# Patient Record
Sex: Male | Born: 1965 | Race: Asian | Hispanic: No | Smoking: Current every day smoker
Health system: Southern US, Community
[De-identification: ages and names within clinical notes are randomized; demographics above are authoritative.]

## PROBLEM LIST (undated history)

## (undated) DIAGNOSIS — K219 Gastro-esophageal reflux disease without esophagitis: Secondary | ICD-10-CM

## (undated) DIAGNOSIS — F419 Anxiety disorder, unspecified: Secondary | ICD-10-CM

## (undated) DIAGNOSIS — F32A Depression, unspecified: Secondary | ICD-10-CM

## (undated) DIAGNOSIS — F329 Major depressive disorder, single episode, unspecified: Secondary | ICD-10-CM

## (undated) HISTORY — PX: VARICOSE VEIN SURGERY: SHX832

## (undated) HISTORY — PX: NASAL SEPTUM SURGERY: SHX37

## (undated) HISTORY — PX: TONSILLECTOMY: SUR1361

---

## 2016-05-13 ENCOUNTER — Emergency Department
Admission: EM | Admit: 2016-05-13 | Discharge: 2016-05-14 | Disposition: A | Discharge status: Home / Self Care | Payer: 59 | Attending: Emergency Medicine | Admitting: Emergency Medicine

## 2016-05-13 ENCOUNTER — Encounter: Payer: Self-pay | Admitting: Emergency Medicine

## 2016-05-13 ENCOUNTER — Emergency Department: Payer: 59

## 2016-05-13 DIAGNOSIS — S8251XA Displaced fracture of medial malleolus of right tibia, initial encounter for closed fracture: Secondary | ICD-10-CM | POA: Insufficient documentation

## 2016-05-13 DIAGNOSIS — S82401A Unspecified fracture of shaft of right fibula, initial encounter for closed fracture: Secondary | ICD-10-CM

## 2016-05-13 DIAGNOSIS — Y929 Unspecified place or not applicable: Secondary | ICD-10-CM | POA: Diagnosis not present

## 2016-05-13 DIAGNOSIS — Y999 Unspecified external cause status: Secondary | ICD-10-CM | POA: Diagnosis not present

## 2016-05-13 DIAGNOSIS — W010XXA Fall on same level from slipping, tripping and stumbling without subsequent striking against object, initial encounter: Secondary | ICD-10-CM | POA: Diagnosis not present

## 2016-05-13 DIAGNOSIS — S82431A Displaced oblique fracture of shaft of right fibula, initial encounter for closed fracture: Secondary | ICD-10-CM | POA: Diagnosis not present

## 2016-05-13 DIAGNOSIS — S82891A Other fracture of right lower leg, initial encounter for closed fracture: Secondary | ICD-10-CM

## 2016-05-13 DIAGNOSIS — Y939 Activity, unspecified: Secondary | ICD-10-CM | POA: Diagnosis not present

## 2016-05-13 DIAGNOSIS — F17200 Nicotine dependence, unspecified, uncomplicated: Secondary | ICD-10-CM | POA: Diagnosis not present

## 2016-05-13 DIAGNOSIS — S8991XA Unspecified injury of right lower leg, initial encounter: Secondary | ICD-10-CM | POA: Diagnosis present

## 2016-05-13 HISTORY — DX: Major depressive disorder, single episode, unspecified: F32.9

## 2016-05-13 HISTORY — DX: Depression, unspecified: F32.A

## 2016-05-13 MED ORDER — TRAMADOL HCL 50 MG PO TABS: 50.0000 mg | ORAL_TABLET | Freq: Four times a day (QID) | ORAL | 0 refills | Status: AC | PRN

## 2016-05-13 MED ORDER — IBUPROFEN 600 MG PO TABS: 600.0000 mg | ORAL_TABLET | Freq: Three times a day (TID) | ORAL | 0 refills | Status: AC | PRN

## 2016-05-13 MED ORDER — IBUPROFEN 600 MG PO TABS
600.0000 mg | ORAL_TABLET | Freq: Once | ORAL | Status: AC
  Administered 2016-05-13: 600 mg via ORAL
  Filled 2016-05-13: qty 1

## 2016-05-13 MED ORDER — TRAMADOL HCL 50 MG PO TABS
50.0000 mg | ORAL_TABLET | Freq: Once | ORAL | Status: AC
Start: 2016-05-14 — End: 2016-05-13
  Administered 2016-05-13: 50 mg via ORAL
  Filled 2016-05-13: qty 1

## 2016-05-13 NOTE — ED Notes (Signed)
Pt had mechanical fall tonight, c/o RT ankle pain, unable to bear weight. No deformity noted.

## 2016-05-13 NOTE — ED Triage Notes (Signed)
Tripped - fell, rt ankle/shin pain. Brought in by ems. Pt in one of their splints

## 2016-05-13 NOTE — ED Provider Notes (Signed)
[facility] Emergency Department Provider Note   ____________________________________________   None    (approximate)  I have reviewed the triage vital signs and the nursing notes.   HISTORY  Chief Complaint Ankle Pain    HPI Joshua Matthews is a 50 y.o. male patient complain of right shin and right medial ankle pain secondary to a trip and fall. Patient brought in by EMS place in a splint. Patient stated pain increases with weightbearing. Patient rates his pain as a 6/10. Patient refuses pain medication at this time. The pain as a dull constant ache. No palliative measures for this complaint.   Past Medical History:  Diagnosis Date  . Depression     There are no active problems to display for this patient.   History reviewed. No pertinent surgical history.  Prior to Admission medications   Medication Sig Start Date End Date Taking? Authorizing Provider  ibuprofen (ADVIL,MOTRIN) 600 MG tablet Take 1 tablet (600 mg total) by mouth every 8 (eight) hours as needed. 05/13/16   Joni Reiningonald K Mckynzi Cammon, PA-C  traMADol (ULTRAM) 50 MG tablet Take 1 tablet (50 mg total) by mouth every 6 (six) hours as needed. 05/13/16 05/13/17  Joni Reiningonald K Yechiel Erny, PA-C    Allergies Patient has no known allergies.  History reviewed. No pertinent family history.  Social History Social History  Substance Use Topics  . Smoking status: Current Every Day Smoker    Packs/day: 0.40  . Smokeless tobacco: Never Used  . Alcohol use Yes     Comment: ocassionally    Review of Systems Constitutional: No fever/chills Eyes: No visual changes. ENT: No sore throat. Cardiovascular: Denies chest pain. Respiratory: Denies shortness of breath. Gastrointestinal: No abdominal pain.  No nausea, no vomiting.  No diarrhea.  No constipation. Genitourinary: Negative for dysuria. Musculoskeletal: Left leg and ankle pain. Skin: Negative for rash. Neurological: Negative for headaches, focal weakness  or numbness. Psychiatric:Depression 10-point ROS otherwise negative.  ____________________________________________   PHYSICAL EXAM:  VITAL SIGNS: ED Triage Vitals  Enc Vitals Group     BP 05/13/16 2241 (!) 111/54     Pulse Rate 05/13/16 2241 79     Resp 05/13/16 2241 18     Temp 05/13/16 2241 98.5 F (36.9 C)     Temp src --      SpO2 05/13/16 2241 97 %     Weight 05/13/16 2242 195 lb (88.5 kg)     Height 05/13/16 2242 5\' 8"  (1.727 m)     Head Circumference --      Peak Flow --      Pain Score 05/13/16 2242 6     Pain Loc --      Pain Edu? --      Excl. in GC? --     Constitutional: Alert and oriented. Well appearing and in no acute distress. Eyes: Conjunctivae are normal. PERRL. EOMI. Head: Atraumatic. Nose: No congestion/rhinnorhea. Mouth/Throat: Mucous membranes are moist.  Oropharynx non-erythematous. Neck: No stridor.  No cervical spine tenderness to palpation. Hematological/Lymphatic/Immunilogical: No cervical lymphadenopathy. Cardiovascular: Normal rate, regular rhythm. Grossly normal heart sounds.  Good peripheral circulation. Respiratory: Normal respiratory effort.  No retractions. Lungs CTAB. Gastrointestinal: Soft and nontender. No distention. No abdominal bruits. No CVA tenderness. Musculoskeletal: No lower extremity tenderness nor edema.  No joint effusions. Neurologic:  Normal speech and language. No gross focal neurologic deficits are appreciated. No gait instability. Skin:  Skin is warm, dry and intact. No rash noted. Psychiatric: Mood and affect  are normal. Speech and behavior are normal.  ____________________________________________   LABS (all labs ordered are listed, but only abnormal results are displayed)  Labs Reviewed - No data to display ____________________________________________  EKG   ____________________________________________  RADIOLOGY  Right proximal fibular fracture and right medial malleolus  fracture. ____________________________________________   PROCEDURES  Procedure(s) performed: None  Procedures  Critical Care performed: No  ____________________________________________   INITIAL IMPRESSION / ASSESSMENT AND PLAN / ED COURSE  Pertinent labs & imaging results that were available during my care of the patient were reviewed by me and considered in my medical decision making (see chart for details).  Right proximal fibular fracture and right medial malleolus fracture. Patient advised to wear splint until evaluation by orthopedics. Advised patient to call in the morning to schedule appointment. Patient given a prescription for tramadol and ibuprofen.  Clinical Course    Patient placed in a posterior splint and given crutches for ambulation.  ____________________________________________   FINAL CLINICAL IMPRESSION(S) / ED DIAGNOSES  Final diagnoses:  Closed fracture of shaft of right fibula, unspecified fracture morphology, initial encounter  Closed fracture of right ankle, initial encounter      NEW MEDICATIONS STARTED DURING THIS VISIT:  New Prescriptions   IBUPROFEN (ADVIL,MOTRIN) 600 MG TABLET    Take 1 tablet (600 mg total) by mouth every 8 (eight) hours as needed.   TRAMADOL (ULTRAM) 50 MG TABLET    Take 1 tablet (50 mg total) by mouth every 6 (six) hours as needed.     Note:  This document was prepared using Dragon voice recognition software and may include unintentional dictation errors.    Joni Reiningonald K Liannah Yarbough, PA-C 05/13/16 2358    Jene Everyobert Kinner, MD 05/14/16 1027

## 2016-05-13 NOTE — Discharge Instructions (Signed)
Wear splint and ambulate with crutches until evaluation by orthopedic Dr. °

## 2016-05-17 ENCOUNTER — Encounter
Admission: RE | Admit: 2016-05-17 | Discharge: 2016-05-17 | Disposition: A | Discharge status: Home / Self Care | Payer: Commercial Managed Care - HMO | Source: Ambulatory Visit | Attending: Podiatry | Admitting: Podiatry

## 2016-05-17 HISTORY — DX: Anxiety disorder, unspecified: F41.9

## 2016-05-17 HISTORY — DX: Gastro-esophageal reflux disease without esophagitis: K21.9

## 2016-05-17 MED ORDER — CEFAZOLIN SODIUM-DEXTROSE 2-4 GM/100ML-% IV SOLN
2.0000 g | Freq: Once | INTRAVENOUS | Status: AC
  Administered 2016-05-18: 2 g via INTRAVENOUS

## 2016-05-17 NOTE — Patient Instructions (Signed)
  Your procedure is scheduled on: 05-18-16 Report to Same Day Surgery 2nd floor medical mall [facility](Medical Mall Entrance-take elevator on left to 2nd floor.  Check in with surgery information desk.) To find out your arrival time please call [phone] between 1PM - 3PM on 05-17-16  Remember: Instructions that are not followed completely may result in serious medical risk, up to and including death, or upon the discretion of your surgeon and anesthesiologist your surgery may need to be rescheduled.    _x___ 1. Do not eat food or drink liquids after midnight. No gum chewing or hard candies.     __x__ 2. No Alcohol for 24 hours before or after surgery.   __x__3. No Smoking for 24 prior to surgery.   ____  4. Bring all medications with you on the day of surgery if instructed.    __x__ 5. Notify your doctor if there is any change in your medical condition     (cold, fever, infections).     Do not wear jewelry, make-up, hairpins, clips or nail polish.  Do not wear lotions, powders, or perfumes. You may wear deodorant.  Do not shave 48 hours prior to surgery. Men may shave face and neck.  Do not bring valuables to the hospital.    [facility] not [facility] for any belongings or valuables.               Contacts, dentures or bridgework may not be worn into surgery.  Leave your suitcase in the car. After surgery it may be brought to your room.  For patients admitted to the hospital, discharge time is determined by your treatment team.   Patients discharged the day of surgery will not be allowed to drive home.  You will need someone to drive you home and stay with you the night of your procedure.    Please read over the following fact sheets that you were given:   [facility] Preparing [facility] and or MRSA Information   _x___ Take these medicines the morning of surgery with A SIP OF WATER:    1. LEXAPRO  2.  3.  4.  5.  6.  ____Fleets enema or Magnesium Citrate as  directed.   ____ Use CHG Soap or sage wipes as directed on instruction sheet   ____ Use inhalers on the day of surgery and bring to hospital day of surgery  ____ Stop metformin 2 days prior to surgery    ____ Take 1/2 of usual insulin dose the night before surgery and none on the morning of surgery.   ____ Stop Aspirin, Coumadin, Pllavix ,Eliquis, Effient, or Pradaxa  x__ Stop Anti-inflammatories such as Advil, Aleve, Ibuprofen, Motrin, Naproxen,          Naprosyn, Goodies powders or aspirin products-STOP IBUPROFEN NOW-Ok to take Tylenol OR TRAMADOL   ____ Stop supplements until after surgery.    ____ Bring C-Pap to the hospital.

## 2016-05-18 ENCOUNTER — Ambulatory Visit
Admission: RE | Admit: 2016-05-18 | Discharge: 2016-05-18 | Disposition: A | Discharge status: Home / Self Care | Payer: Commercial Managed Care - HMO | Source: Ambulatory Visit | Attending: Podiatry | Admitting: Podiatry

## 2016-05-18 ENCOUNTER — Encounter
Admission: RE | Disposition: A | Discharge status: Home / Self Care | Payer: Self-pay | Source: Ambulatory Visit | Attending: Podiatry

## 2016-05-18 ENCOUNTER — Encounter: Payer: Self-pay | Admitting: *Deleted

## 2016-05-18 ENCOUNTER — Ambulatory Visit: Payer: Commercial Managed Care - HMO | Admitting: Anesthesiology

## 2016-05-18 ENCOUNTER — Ambulatory Visit: Payer: Commercial Managed Care - HMO

## 2016-05-18 DIAGNOSIS — K219 Gastro-esophageal reflux disease without esophagitis: Secondary | ICD-10-CM | POA: Diagnosis not present

## 2016-05-18 DIAGNOSIS — S82841A Displaced bimalleolar fracture of right lower leg, initial encounter for closed fracture: Secondary | ICD-10-CM | POA: Insufficient documentation

## 2016-05-18 DIAGNOSIS — F17210 Nicotine dependence, cigarettes, uncomplicated: Secondary | ICD-10-CM | POA: Insufficient documentation

## 2016-05-18 DIAGNOSIS — F419 Anxiety disorder, unspecified: Secondary | ICD-10-CM | POA: Diagnosis not present

## 2016-05-18 DIAGNOSIS — X58XXXA Exposure to other specified factors, initial encounter: Secondary | ICD-10-CM | POA: Insufficient documentation

## 2016-05-18 DIAGNOSIS — F329 Major depressive disorder, single episode, unspecified: Secondary | ICD-10-CM | POA: Insufficient documentation

## 2016-05-18 DIAGNOSIS — S93431A Sprain of tibiofibular ligament of right ankle, initial encounter: Secondary | ICD-10-CM | POA: Insufficient documentation

## 2016-05-18 DIAGNOSIS — Z419 Encounter for procedure for purposes other than remedying health state, unspecified: Secondary | ICD-10-CM

## 2016-05-18 HISTORY — PX: ORIF ANKLE FRACTURE: SHX5408

## 2016-05-18 SURGERY — OPEN REDUCTION INTERNAL FIXATION (ORIF) ANKLE FRACTURE
Anesthesia: General | Site: Ankle | Laterality: Right | Wound class: Clean

## 2016-05-18 MED ORDER — MIDAZOLAM HCL 2 MG/2ML IJ SOLN
INTRAMUSCULAR | Status: DC | PRN
  Administered 2016-05-18: 2 mg via INTRAVENOUS

## 2016-05-18 MED ORDER — DEXAMETHASONE SODIUM PHOSPHATE 10 MG/ML IJ SOLN
INTRAMUSCULAR | Status: DC | PRN
  Administered 2016-05-18: 8 mg via INTRAVENOUS

## 2016-05-18 MED ORDER — BUPIVACAINE HCL (PF) 0.25 % IJ SOLN
INTRAMUSCULAR | Status: AC
  Filled 2016-05-18: qty 30

## 2016-05-18 MED ORDER — OXYCODONE-ACETAMINOPHEN 5-325 MG PO TABS: 1.0000 | ORAL_TABLET | ORAL | 0 refills | Status: AC | PRN

## 2016-05-18 MED ORDER — ONDANSETRON HCL 4 MG/2ML IJ SOLN
INTRAMUSCULAR | Status: DC | PRN
  Administered 2016-05-18: 4 mg via INTRAVENOUS

## 2016-05-18 MED ORDER — FAMOTIDINE 20 MG PO TABS
ORAL_TABLET | ORAL | Status: AC
  Filled 2016-05-18: qty 1

## 2016-05-18 MED ORDER — ONDANSETRON HCL 4 MG/2ML IJ SOLN: 4.0000 mg | Freq: Once | INTRAMUSCULAR | Status: DC | PRN

## 2016-05-18 MED ORDER — LIDOCAINE HCL (PF) 1 % IJ SOLN
INTRAMUSCULAR | Status: AC
  Filled 2016-05-18: qty 30

## 2016-05-18 MED ORDER — ROPIVACAINE HCL 5 MG/ML IJ SOLN
INTRAMUSCULAR | Status: AC
  Filled 2016-05-18: qty 40

## 2016-05-18 MED ORDER — FENTANYL CITRATE (PF) 100 MCG/2ML IJ SOLN: 25.0000 ug | INTRAMUSCULAR | Status: DC | PRN

## 2016-05-18 MED ORDER — BUPIVACAINE HCL (PF) 0.5 % IJ SOLN
INTRAMUSCULAR | Status: AC
  Filled 2016-05-18: qty 30

## 2016-05-18 MED ORDER — MIDAZOLAM HCL 2 MG/2ML IJ SOLN
INTRAMUSCULAR | Status: AC
  Administered 2016-05-18: 2 mg via INTRAVENOUS
  Filled 2016-05-18: qty 2

## 2016-05-18 MED ORDER — MIDAZOLAM HCL 2 MG/2ML IJ SOLN
2.0000 mg | Freq: Once | INTRAMUSCULAR | Status: AC
  Administered 2016-05-18: 2 mg via INTRAVENOUS

## 2016-05-18 MED ORDER — CEFAZOLIN SODIUM-DEXTROSE 2-4 GM/100ML-% IV SOLN
INTRAVENOUS | Status: AC
  Filled 2016-05-18: qty 100

## 2016-05-18 MED ORDER — ONDANSETRON HCL 4 MG/2ML IJ SOLN: 4.0000 mg | Freq: Four times a day (QID) | INTRAMUSCULAR | Status: DC | PRN

## 2016-05-18 MED ORDER — EPINEPHRINE PF 1 MG/ML IJ SOLN
INTRAMUSCULAR | Status: AC
  Filled 2016-05-18: qty 1

## 2016-05-18 MED ORDER — LACTATED RINGERS IV SOLN
INTRAVENOUS | Status: DC
  Administered 2016-05-18 (×3): via INTRAVENOUS

## 2016-05-18 MED ORDER — PHENYLEPHRINE HCL 10 MG/ML IJ SOLN
INTRAMUSCULAR | Status: DC | PRN
  Administered 2016-05-18: 100 ug via INTRAVENOUS

## 2016-05-18 MED ORDER — FENTANYL CITRATE (PF) 100 MCG/2ML IJ SOLN
INTRAMUSCULAR | Status: DC | PRN
  Administered 2016-05-18: 100 ug via INTRAVENOUS
  Administered 2016-05-18 (×2): 25 ug via INTRAVENOUS

## 2016-05-18 MED ORDER — ROPIVACAINE HCL 5 MG/ML IJ SOLN
INTRAMUSCULAR | Status: DC | PRN
  Administered 2016-05-18: 30 mL via EPIDURAL

## 2016-05-18 MED ORDER — LIDOCAINE HCL (PF) 1 % IJ SOLN
INTRAMUSCULAR | Status: AC
  Filled 2016-05-18: qty 5

## 2016-05-18 MED ORDER — FAMOTIDINE 20 MG PO TABS
20.0000 mg | ORAL_TABLET | Freq: Once | ORAL | Status: AC
  Administered 2016-05-18: 20 mg via ORAL

## 2016-05-18 MED ORDER — BUPIVACAINE HCL 0.25 % IJ SOLN
INTRAMUSCULAR | Status: DC | PRN
  Administered 2016-05-18: 15 mL

## 2016-05-18 MED ORDER — PROPOFOL 10 MG/ML IV BOLUS
INTRAVENOUS | Status: DC | PRN
  Administered 2016-05-18: 200 mg via INTRAVENOUS

## 2016-05-18 MED ORDER — LIDOCAINE HCL (CARDIAC) 20 MG/ML IV SOLN
INTRAVENOUS | Status: DC | PRN
  Administered 2016-05-18: 100 mg via INTRAVENOUS

## 2016-05-18 MED ORDER — ONDANSETRON HCL 4 MG PO TABS: 4.0000 mg | ORAL_TABLET | Freq: Four times a day (QID) | ORAL | Status: DC | PRN

## 2016-05-18 SURGICAL SUPPLY — 54 items
BANDAGE ELASTIC 4 LF NS (GAUZE/BANDAGES/DRESSINGS) ×2 IMPLANT
BANDAGE STRETCH 3X4.1 STRL (GAUZE/BANDAGES/DRESSINGS) ×2 IMPLANT
BIT DRILL CANNULTD 2.6 X 130MM (MISCELLANEOUS) ×1 IMPLANT
BLADE SURG 15 STRL LF DISP TIS (BLADE) IMPLANT
BLADE SURG 15 STRL SS (BLADE)
BNDG COHESIVE 4X5 TAN STRL (GAUZE/BANDAGES/DRESSINGS) ×2 IMPLANT
BNDG ESMARK 4X12 TAN STRL LF (GAUZE/BANDAGES/DRESSINGS) ×2 IMPLANT
BNDG GAUZE 4.5X4.1 6PLY STRL (MISCELLANEOUS) ×2 IMPLANT
CANISTER SUCT 1200ML W/VALVE (MISCELLANEOUS) ×2 IMPLANT
COUNTERSICK 4.0 HEADED (MISCELLANEOUS) ×2
CUFF TOURN 18 STER (MISCELLANEOUS) ×2 IMPLANT
CUFF TOURN 30 STER DUAL PORT (MISCELLANEOUS) IMPLANT
DRAPE C-ARM XRAY 36X54 (DRAPES) ×2 IMPLANT
DRAPE C-ARMOR (DRAPES) ×2 IMPLANT
DRILL CANNULATED 2.6 X 130MM (MISCELLANEOUS) ×2
DURAPREP 26ML APPLICATOR (WOUND CARE) ×2 IMPLANT
ELECT REM PT RETURN 9FT ADLT (ELECTROSURGICAL) ×2
ELECTRODE REM PT RTRN 9FT ADLT (ELECTROSURGICAL) ×1 IMPLANT
FIXATION ZIPTIGHT ANKLE SNDSMS (Ankle) ×1 IMPLANT
GAUZE PETRO XEROFOAM 1X8 (MISCELLANEOUS) ×2 IMPLANT
GAUZE SPONGE 4X4 12PLY STRL (GAUZE/BANDAGES/DRESSINGS) ×2 IMPLANT
GAUZE STRETCH 2X75IN STRL (MISCELLANEOUS) ×2 IMPLANT
GLOVE BIO SURGEON STRL SZ7.5 (GLOVE) ×2 IMPLANT
GLOVE INDICATOR 8.0 STRL GRN (GLOVE) ×2 IMPLANT
GOWN STRL REUS W/ TWL LRG LVL3 (GOWN DISPOSABLE) ×3 IMPLANT
GOWN STRL REUS W/TWL LRG LVL3 (GOWN DISPOSABLE) ×3
K-WIRE SNGL END 1.2X150 (MISCELLANEOUS) ×6
KIT RM TURNOVER STRD PROC AR (KITS) ×2 IMPLANT
KWIRE SNGL END 1.2X150 (MISCELLANEOUS) ×3 IMPLANT
LABEL OR SOLS (LABEL) ×2 IMPLANT
NS IRRIG 500ML POUR BTL (IV SOLUTION) ×2 IMPLANT
OLIVE WIRE ×2 IMPLANT
PACK EXTREMITY ARMC (MISCELLANEOUS) ×2 IMPLANT
PAD ABD DERMACEA PRESS 5X9 (GAUZE/BANDAGES/DRESSINGS) ×2 IMPLANT
PAD PREP 24X41 OB/GYN DISP (PERSONAL CARE ITEMS) ×2 IMPLANT
PLATE 2HOLE LOCKING 1.1MM (Plate) ×4 IMPLANT
SCREW 4.0 X 40 LT (Screw) ×2 IMPLANT
SCREW 4.0 X 44 LT (Screw) ×2 IMPLANT
SCREW COUNTERSINK 4.0 HEADED (MISCELLANEOUS) ×1 IMPLANT
SPLINT CAST 1 STEP 4X30 (MISCELLANEOUS) ×2 IMPLANT
SPLINT FAST PLASTER 5X30 (CAST SUPPLIES) ×1
SPLINT PLASTER CAST FAST 5X30 (CAST SUPPLIES) ×1 IMPLANT
SPONGE LAP 18X18 5 PK (GAUZE/BANDAGES/DRESSINGS) ×2 IMPLANT
STAPLER SKIN PROX 35W (STAPLE) ×2 IMPLANT
STOCKINETTE M/LG 89821 (MISCELLANEOUS) ×2 IMPLANT
STRAP SAFETY BODY (MISCELLANEOUS) ×2 IMPLANT
STRIP CLOSURE SKIN 1/2X4 (GAUZE/BANDAGES/DRESSINGS) IMPLANT
SUT VIC AB 2-0 CT1 27 (SUTURE) ×1
SUT VIC AB 2-0 CT1 TAPERPNT 27 (SUTURE) ×1 IMPLANT
SUT VIC AB 3-0 SH 27 (SUTURE) ×2
SUT VIC AB 3-0 SH 27X BRD (SUTURE) ×2 IMPLANT
SWABSTK COMLB BENZOIN TINCTURE (MISCELLANEOUS) IMPLANT
SYRINGE 10CC LL (SYRINGE) ×2 IMPLANT
ZIPTIGHT ANKLE SYNODESMOSS FIX (Ankle) ×2 IMPLANT

## 2016-05-18 NOTE — H&P (Signed)
HISTORY AND PHYSICAL INTERVAL NOTE:  05/18/2016  10:08 AM  Joshua Matthews  has presented today for surgery, with the diagnosis of CLOSED TRIMALLEOR FRACTURE RIGHT ANKLE.  The various methods of treatment have been discussed with the patient.  No guarantees were given.  After consideration of risks, benefits and other options for treatment, the patient has consented to surgery.  I have reviewed the patients' chart and labs.    Patient Vitals for the past 24 hrs:  BP Temp Pulse Resp SpO2 Height Weight  05/18/16 1003 (!) 138/113 - 87 14 100 % - -  05/18/16 0914 (!) 133/98 97.2 F (36.2 C) 94 16 100 % 5\' 8"  (1.727 m) 88.5 kg (195 lb)    A history and physical examination was performed in my office.  The patient was reexamined.  There have been no changes to this history and physical examination.  Gwyneth RevelsFowler, Amare Bail A

## 2016-05-18 NOTE — Discharge Instructions (Signed)
[facility] °[facility] ° °POST OPERATIVE INSTRUCTIONS FOR DR. TROXLER AND DR. Jen Benedict °[facility] ° ° °1. Take your medication as prescribed.  Pain medication should be taken only as needed. ° °2. Keep the dressing clean, dry and intact. ° °3. Keep your foot elevated above the heart level for the first 48 hours. ° °4. Walking to the bathroom and brief periods of walking are acceptable, unless we have instructed you to be non-weight bearing. ° °5. Always wear your post-op shoe when walking.  Always use your crutches if you are to be non-weight bearing. ° °6. Do not take a shower. Baths are permissible as long as the foot is kept out of the water.  ° °7. Every hour you are awake:  °- Bend your knee 15 times. °- Massage calf 15 times ° °8. Call [facility] ([phone]) if any of the following problems occur: °- You develop a temperature or fever. °- The bandage becomes saturated with blood. °- Medication does not stop your pain. °- Injury of the foot occurs. °- Any symptoms of infection including redness, odor, or red streaks running from wound. ° °

## 2016-05-18 NOTE — Anesthesia Preprocedure Evaluation (Signed)
Anesthesia Evaluation  Patient identified by MRN, date of birth, ID band Patient awake    Reviewed: Allergy & Precautions, H&P , NPO status , Patient's Chart, lab work & pertinent test results, reviewed documented beta blocker date and time   Airway Mallampati: II  TM Distance: >3 FB Neck ROM: full    Dental  (+) Teeth Intact   Pulmonary neg pulmonary ROS, Current Smoker,    Pulmonary exam normal        Cardiovascular Exercise Tolerance: Good negative cardio ROS Normal cardiovascular exam Rate:Normal     Neuro/Psych PSYCHIATRIC DISORDERS Anxiety Depression negative neurological ROS  negative psych ROS   GI/Hepatic negative GI ROS, Neg liver ROS, GERD  ,  Endo/Other  negative endocrine ROS  Renal/GU negative Renal ROS  negative genitourinary   Musculoskeletal   Abdominal   Peds  Hematology negative hematology ROS (+)   Anesthesia Other Findings   Reproductive/Obstetrics negative OB ROS                             Anesthesia Physical Anesthesia Plan  ASA: II  Anesthesia Plan: General LMA   Post-op Pain Management:  Regional for Post-op pain   Induction:   Airway Management Planned:   Additional Equipment:   Intra-op Plan:   Post-operative Plan:   Informed Consent: I have reviewed the patients History and Physical, chart, labs and discussed the procedure including the risks, benefits and alternatives for the proposed anesthesia with the patient or authorized representative who has indicated his/her understanding and acceptance.     Plan Discussed with: CRNA  Anesthesia Plan Comments:         Anesthesia Quick Evaluation

## 2016-05-18 NOTE — Anesthesia Procedure Notes (Signed)
Procedure Name: LMA Insertion Date/Time: 05/18/2016 10:32 AM Performed by: Junious SilkNOLES, Stephanee Barcomb Pre-anesthesia Checklist: Patient identified, Patient being monitored, Timeout performed, Emergency Drugs available and Suction available Patient Re-evaluated:Patient Re-evaluated prior to inductionOxygen Delivery Method: Circle system utilized Preoxygenation: Pre-oxygenation with 100% oxygen Intubation Type: IV induction Ventilation: Mask ventilation without difficulty LMA: LMA inserted LMA Size: 4.5 Tube type: Oral Number of attempts: 1 Placement Confirmation: positive ETCO2 and breath sounds checked- equal and bilateral Tube secured with: Tape Dental Injury: Teeth and Oropharynx as per pre-operative assessment

## 2016-05-18 NOTE — Anesthesia Procedure Notes (Signed)
Anesthesia Regional Block:  Popliteal block  Pre-Anesthetic Checklist: ,, timeout performed, Correct Patient, Correct Site, Correct Laterality, Correct Procedure, Correct Position, site marked, Risks and benefits discussed,  Surgical consent,  Pre-op evaluation,  At surgeon's request and post-op pain management  Laterality: Right  Prep: alcohol swabs       Needles:  Injection technique: Single-shot  Needle Type: Echogenic Stimulator Needle     Needle Length: 10cm 10 cm Needle Gauge: 22 and 22 G    Additional Needles:  Procedures: ultrasound guided (picture in chart) and nerve stimulator Popliteal block Narrative:  Injection made incrementally with aspirations every 5 mL.  Performed by: Personally   Additional Notes: Functioning IV was confirmed and monitors were applied.  A 22ga Stimuplex needle was used. Sterile prep and drape,hand hygiene and sterile gloves were used.  Negative aspiration and negative test dose prior to incremental administration of local anesthetic. The patient tolerated the procedure well. No pain with injection and picture in chart

## 2016-05-18 NOTE — Transfer of Care (Signed)
Immediate Anesthesia Transfer of Care Note  Patient: Joshua Matthews  Procedure(s) Performed: Procedure(s): OPEN REDUCTION INTERNAL FIXATION (ORIF) ANKLE FRACTURE (Right)  Patient Location: PACU  Anesthesia Type:General  Level of Consciousness: sedated  Airway & Oxygen Therapy: Patient Spontanous Breathing and Patient connected to face mask oxygen  Post-op Assessment: Report given to RN and Post -op Vital signs reviewed and stable  Post vital signs: Reviewed and stable  Last Vitals:  Vitals:   05/18/16 1018 05/18/16 1023  BP: 128/80   Pulse: 79   Resp: 15 19  Temp:      Last Pain:  Vitals:   05/18/16 0914  PainSc: 7          Complications: No apparent anesthesia complications

## 2016-05-18 NOTE — Op Note (Signed)
Operative note   Surgeon:Finley Dinkel    Assistant:none    Preop diagnosis: 1. Bimalleolar right ankle fracture 2. Syndesmotic rupture right ankle    Postop diagnosis: Same    Procedure: 1. Open reduction with internal fixation bimalleolar ankle fracture right ankle 2. Repair syndesmosis right ankle    EBL: Minimal    Anesthesia:Gen. with popliteal block    Hemostasis: Thigh tourniquet inflated to 250 mmHg for approximate 60 minutes    Specimen: None    Complications: None    Operative indications:Joshua Matthews is an 50 y.o. that presents today for surgical intervention.  The risks/benefits/alternatives/complications have been discussed and consent has been given.    Procedure:  Patient was brought into the OR and placed on the operating table in thesupine position. After anesthesia was obtained theright lower extremity was prepped and draped in usual sterile fashion.  Attention was directed to the medial aspect of the ankle where a medial incision was performed. Sharp and blunt dissection carried down to the periosteum. Subperiosteal dissection was undertaken. The medial malleolus fragment and fracture was noted. The fracture site was cleared. The ankle joint was evaluated and no signs of obvious osteochondral defects noted. The fracture site was then reduced with bone reduction forceps. Next 2 guidewires for the 4.0 mm cannulated screws from the [employer] 28 screw set were placed across the fracture site. Standard technique was used and 2x4.0 mm screws were placed. Good Alignment and stability was noted. Posterior malleolus fracture was evaluated and found to be less than 25% of the articular surface and in good anatomic alignment. We did not fixate this fracture.  Next the syndesmosis repair was performed. A lateral fibular incision was performed. Sharp and blunt dissection carried down to the periosteum. A 2 hole plate was temporarily stabilized with a K wire. Next the zip tight  syndesmotic repair kit was used. Initially the guidewire was placed from the lateral aspect of the fibula to the medial malleolus region. Good alignment was noted on fluoroscopy. This was then overdrilled and the zip tight suture Endobutton was placed through the drill hole. This was then stabilized and compressed down to the small 2 hole plate on the lateral aspect of the fibula. Was noted and good stability was noted. The K wire was removed. All wounds were flushed with copious amounts or irrigation. Layered closure performed with a 3-0 Vicryl for the deeper and subcutaneous tissue and a skin staples for skin. 0.25% Marcaine was used around all areas. A large compressive bulky dressing was applied with a posterior splint.    Patient tolerated the procedure and anesthesia well.  Was transported from the OR to the PACU with all vital signs stable and vascular status intact. To be discharged per routine protocol.  Will follow up in approximately 1 week in the outpatient clinic.

## 2016-05-21 ENCOUNTER — Encounter: Payer: Self-pay | Admitting: Podiatry

## 2016-05-21 NOTE — Anesthesia Postprocedure Evaluation (Signed)
Anesthesia Post Note  Patient: Joshua Matthews  Procedure(s) Performed: Procedure(s) (LRB): OPEN REDUCTION INTERNAL FIXATION (ORIF) ANKLE FRACTURE (Right)  Patient location during evaluation: PACU Anesthesia Type: General Level of consciousness: awake and alert Pain management: pain level controlled Vital Signs Assessment: post-procedure vital signs reviewed and stable Respiratory status: spontaneous breathing, nonlabored ventilation, respiratory function stable and patient connected to nasal cannula oxygen Cardiovascular status: blood pressure returned to baseline and stable Postop Assessment: no signs of nausea or vomiting Anesthetic complications: no    Last Vitals:  Vitals:   05/18/16 1351 05/18/16 1414  BP: 139/71 136/75  Pulse: 90 78  Resp: 16 16  Temp: 36.8 C     Last Pain:  Vitals:   05/21/16 0828  TempSrc:   PainSc: 4                  Yevette EdwardsJames G Adams

## 2017-04-27 IMAGING — CR DG TIBIA/FIBULA 2V*R*
1 series · 4 of 4 positions shown · non-contrast
Comparison: None.

CLINICAL DATA: Status post TURP with twisting ankle injury with
proximal tibia/fibula pain and distal medial malleolus.

EXAM:
RIGHT ANKLE - COMPLETE 3+ VIEW; RIGHT TIBIA AND FIBULA - 2 VIEW

[Series 1: dg tibia/fibula right · 0.14mm/px · 4 of 4 slices shown]
[im 1/4]
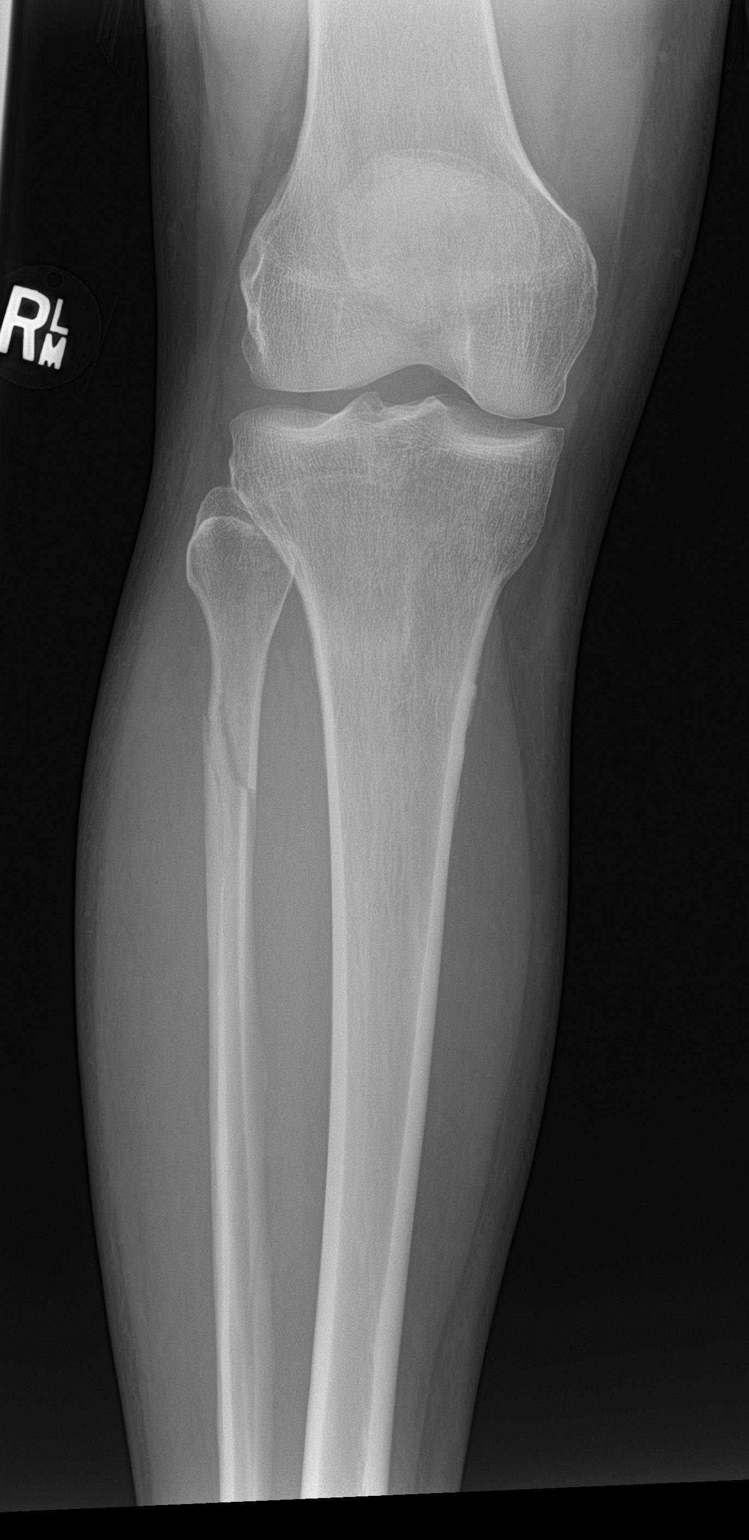
[im 2/4]
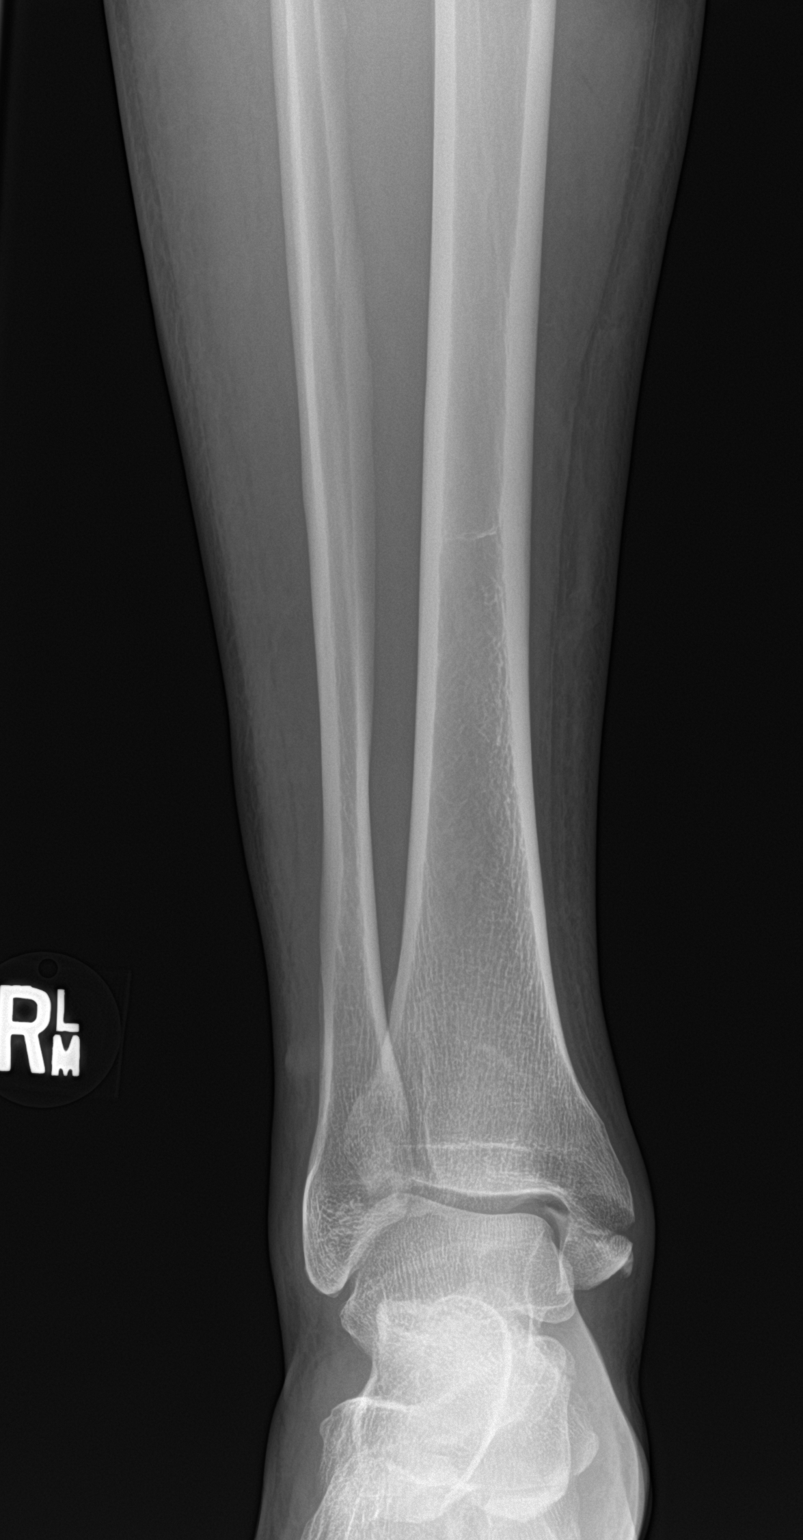
[im 3/4]
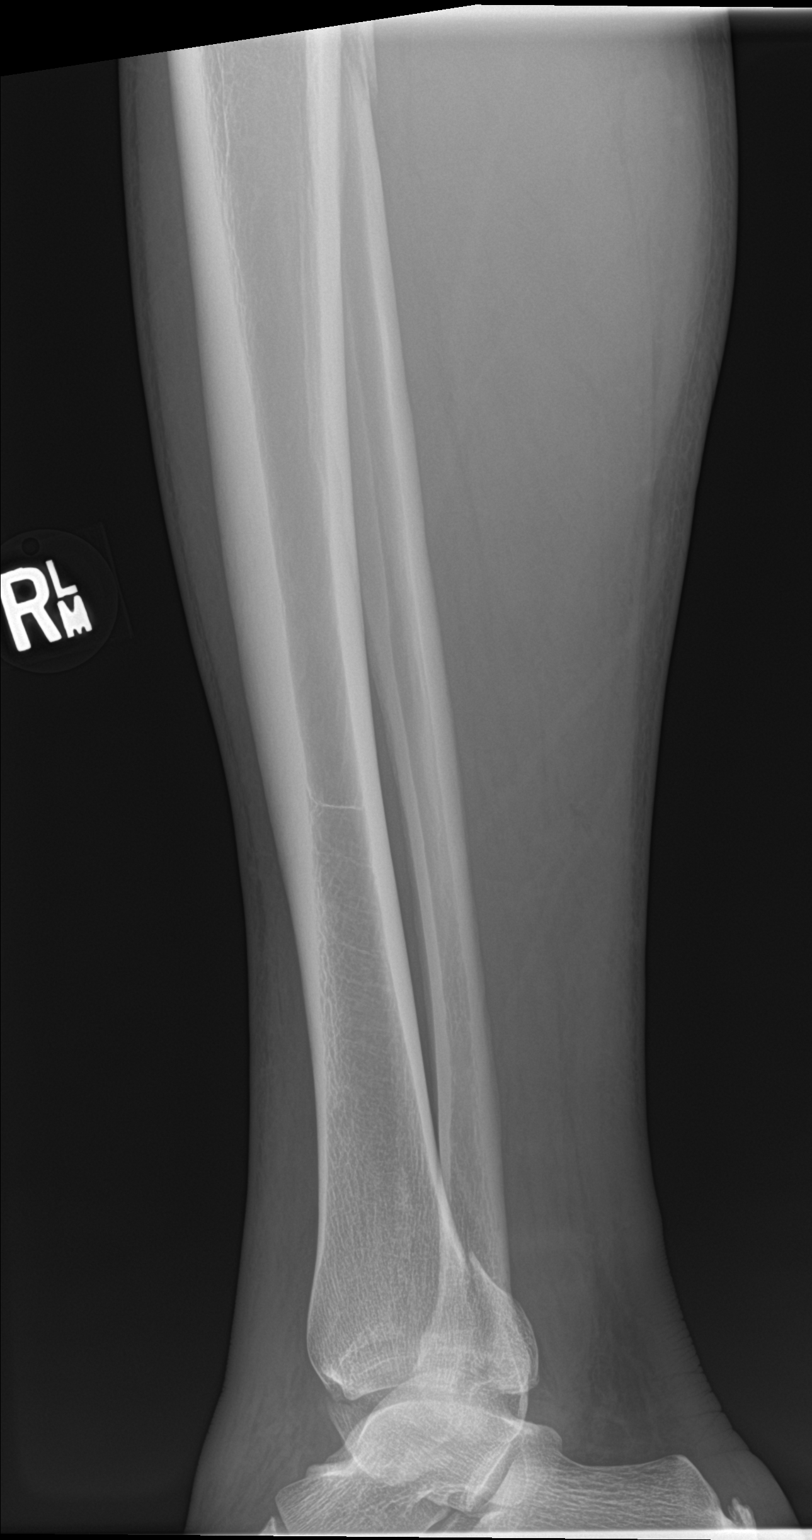
[im 4/4]
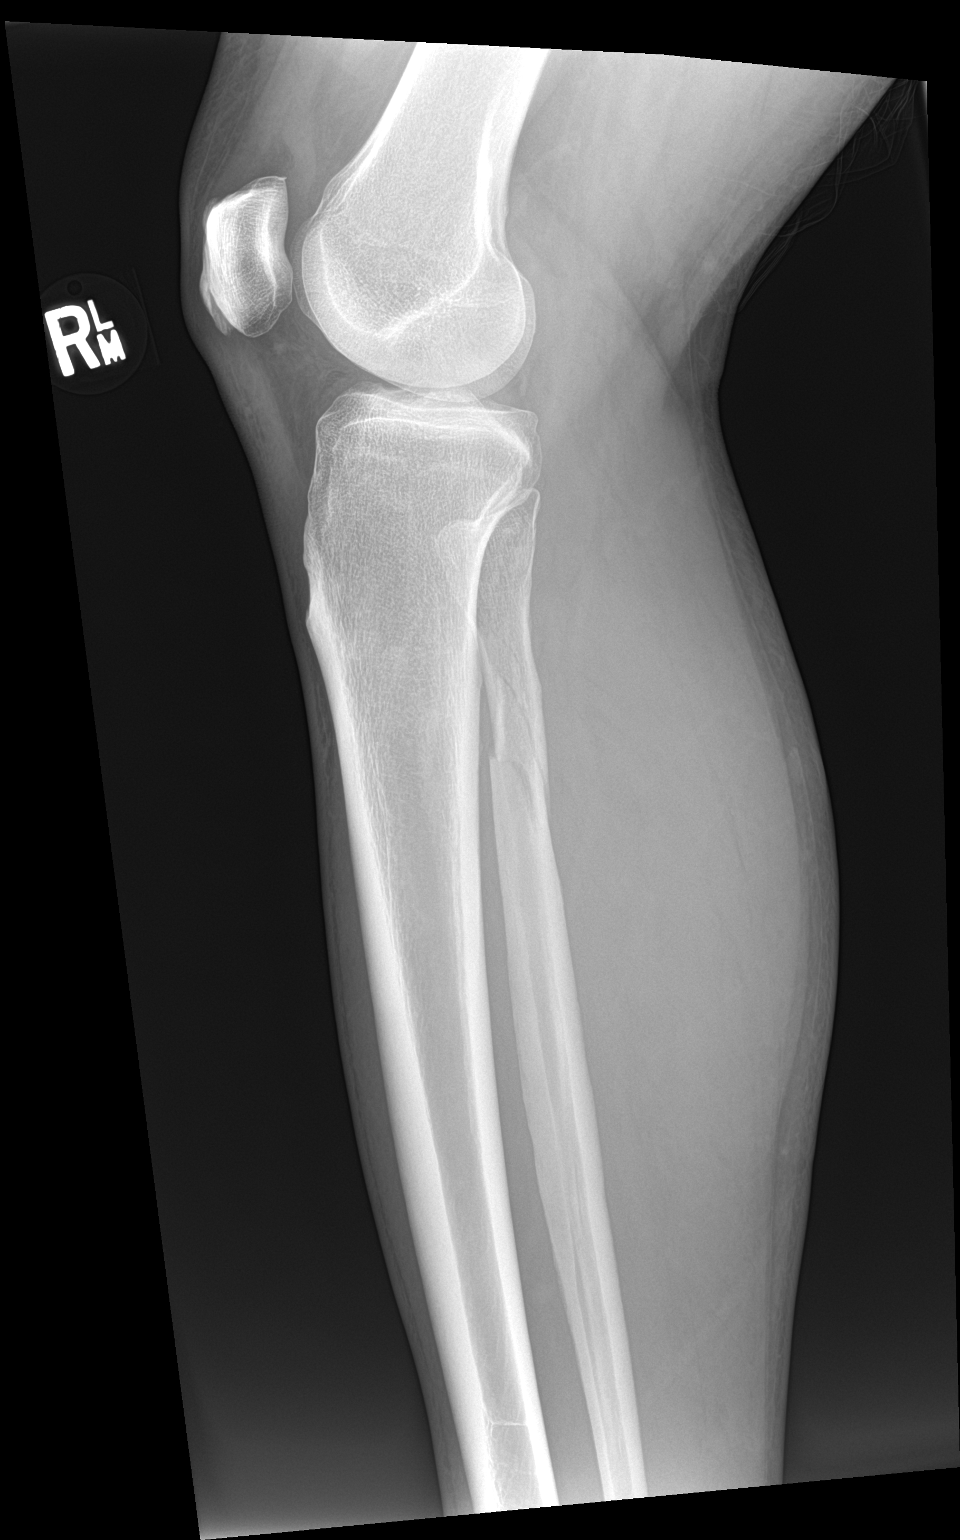

[4 of 4 positions shown; findings below may reference images not displayed]

FINDINGS: Right ankle:

Mild displaced medial malleolar and posterior malleolar fractures.
Talar dome is intact. Mild asymmetry in the ankle mortise with
widening medially. Dorsal and plantar calcaneal enthesophytes.

Right tibia and fibula:

Oblique proximal fibular diaphyseal fracture. Posterior medial
mildly displaced malleolar fractures widening of the medial ankle
mortise. Knee joint is maintained.
IMPRESSION: Mildly displaced posterior and medial malleolar fractures, oblique
proximal mildly displaced fibular diaphyses fracture, and asymmetric
ankle mortise with widening medially.

By: Jewel Rizk M.D.

## 2017-10-03 ENCOUNTER — Telehealth: Payer: Self-pay

## 2017-10-03 ENCOUNTER — Other Ambulatory Visit: Payer: Self-pay

## 2017-10-03 DIAGNOSIS — Z1211 Encounter for screening for malignant neoplasm of colon: Secondary | ICD-10-CM

## 2017-10-03 NOTE — Telephone Encounter (Signed)
Gastroenterology Pre-Procedure Review  Request Date: 12/26/17 Requesting Physician: Dr. Servando [facility]  PATIENT REVIEW QUESTIONS: The patient responded to the following health history questions as indicated:    1. Are you having any GI issues? no 2. Do you have a personal history of Polyps? no 3. Do you have a family history of Colon Cancer or Polyps? no 4. Diabetes Mellitus? no 5. Joint replacements in the past 12 months?ankle surgery last year 6. Major health problems in the past 3 months?no 7. Any artificial heart valves, MVP, or defibrillator?no    MEDICATIONS & ALLERGIES:    Patient reports the following regarding taking any anticoagulation/antiplatelet therapy:   Plavix, Coumadin, Eliquis, Xarelto, Lovenox, Pradaxa, Brilinta, or Effient? no Aspirin? yes (twice a week baby aspirin)  Patient confirms/reports the following medications:  Current Outpatient Medications  Medication Sig Dispense Refill  . calcium carbonate (TUMS - DOSED IN MG ELEMENTAL CALCIUM) 500 MG chewable tablet Chew 1 tablet by mouth as needed for indigestion or heartburn.    . escitalopram (LEXAPRO) 10 MG tablet Take 10 mg by mouth every morning.     Marland Kitchen. ibuprofen (ADVIL,MOTRIN) 600 MG tablet Take 1 tablet (600 mg total) by mouth every 8 (eight) hours as needed. (Patient taking differently: Take 600 mg by mouth every 8 (eight) hours as needed (pain). ) 15 tablet 0  . oxyCODONE-acetaminophen (ROXICET) 5-325 MG tablet Take 1-2 tablets by mouth every 4 (four) hours as needed for severe pain. 30 tablet 0   No current facility-administered medications for this visit.     Patient confirms/reports the following allergies:  No Known Allergies  No orders of the defined types were placed in this encounter.   AUTHORIZATION INFORMATION Primary Insurance: 1D#: Group #:  Secondary Insurance: 1D#: Group #:  SCHEDULE INFORMATION: Date: 12/26/17 Time: Location:[facility]

## 2017-12-05 ENCOUNTER — Telehealth: Payer: Self-pay | Admitting: Gastroenterology

## 2017-12-05 NOTE — Telephone Encounter (Signed)
Contacted [facility] and cancelled pt's appt.

## 2017-12-05 NOTE — Telephone Encounter (Signed)
Pt left vm he needs to cancel 7/11 procedure he has  Couple of reason if you have any question you can call him.

## 2017-12-26 ENCOUNTER — Ambulatory Visit: Admit: 2017-12-26 | Payer: BLUE CROSS/BLUE SHIELD | Admitting: Gastroenterology

## 2017-12-26 SURGERY — COLONOSCOPY WITH PROPOFOL
Anesthesia: Choice
